# Patient Record
Sex: Male | Born: 1990 | Race: Black or African American | Hispanic: No | Marital: Single
Health system: Southern US, Community
[De-identification: ages and names within clinical notes are randomized; demographics above are authoritative.]

---
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
## 2020-02-10 ENCOUNTER — Ambulatory Visit: Payer: Self-pay | Attending: Internal Medicine

## 2020-02-10 DIAGNOSIS — Z23 Encounter for immunization: Secondary | ICD-10-CM

## 2020-02-10 NOTE — Progress Notes (Signed)
   [facility]  Name:  Joshua Campos    MRN: [id number] DOB: Aug 27, 1991  02/10/2020  Mr. [name] was observed post Covid-19 immunization for 15 minutes without incident. He was provided with Vaccine Information Sheet and instruction to access the V-Safe system.   Mr. [name] was instructed to call 911 with any severe reactions post vaccine: Marland Kitchen Difficulty breathing  . Swelling of face and throat  . A fast heartbeat  . A bad rash all over body  . Dizziness and weakness   Immunizations Administered    Name Date Dose VIS Date Route   Pfizer COVID-19 Vaccine 02/10/2020  9:28 AM 0.3 mL 10/08/2019 Intramuscular   Manufacturer: [employer], [employer]   Lot: [id number]   NDC: [address]-7

## 2020-02-18 ENCOUNTER — Other Ambulatory Visit: Payer: Self-pay

## 2020-02-18 ENCOUNTER — Emergency Department (HOSPITAL_COMMUNITY)
Admission: EM | Admit: 2020-02-18 | Discharge: 2020-02-18 | Disposition: A | Discharge status: Home / Self Care | Payer: Self-pay | Attending: Emergency Medicine | Admitting: Emergency Medicine

## 2020-02-18 DIAGNOSIS — Z5321 Procedure and treatment not carried out due to patient leaving prior to being seen by health care provider: Secondary | ICD-10-CM | POA: Insufficient documentation

## 2020-02-18 DIAGNOSIS — Z20822 Contact with and (suspected) exposure to covid-19: Secondary | ICD-10-CM | POA: Insufficient documentation

## 2020-02-18 DIAGNOSIS — J029 Acute pharyngitis, unspecified: Secondary | ICD-10-CM | POA: Insufficient documentation

## 2020-02-18 LAB — GROUP A STREP BY PCR: Group A Strep by PCR: NOT DETECTED

## 2020-02-18 LAB — POC SARS CORONAVIRUS 2 AG -  ED: SARS Coronavirus 2 Ag: NEGATIVE

## 2020-02-18 NOTE — ED Triage Notes (Signed)
Pt wants to be tested for Covid

## 2020-02-18 NOTE — ED Notes (Signed)
Pt stated they were leaving  

## 2020-03-06 ENCOUNTER — Ambulatory Visit: Payer: Self-pay | Attending: Internal Medicine

## 2020-03-06 DIAGNOSIS — Z23 Encounter for immunization: Secondary | ICD-10-CM

## 2020-03-06 NOTE — Progress Notes (Signed)
   Covid-19 Vaccination Clinic  Name:  Joshua Campos    MRN: [id number] DOB: [birth year]  03/06/2020  [name] was observed post Covid-19 immunization for 15 minutes without incident. He was provided with Vaccine Information Sheet and instruction to access the V-Safe system.   [name] was instructed to call 911 with any severe reactions post vaccine: Marland Kitchen Difficulty breathing  . Swelling of face and throat  . A fast heartbeat  . A bad rash all over body  . Dizziness and weakness   Immunizations Administered    Name Date Dose VIS Date Route   Pfizer COVID-19 Vaccine 03/06/2020  9:06 AM 0.3 mL 12/22/2018 Intramuscular   Manufacturer: [employer], [employer]   Lot: [id number]   NDC: [address]-9

## 2020-09-04 ENCOUNTER — Other Ambulatory Visit: Payer: Self-pay

## 2020-09-04 ENCOUNTER — Emergency Department (HOSPITAL_COMMUNITY)
Admission: EM | Admit: 2020-09-04 | Discharge: 2020-09-04 | Disposition: A | Discharge status: Home / Self Care | Payer: Self-pay | Attending: Emergency Medicine | Admitting: Emergency Medicine

## 2020-09-04 ENCOUNTER — Encounter (HOSPITAL_COMMUNITY): Payer: Self-pay | Admitting: Emergency Medicine

## 2020-09-04 DIAGNOSIS — W19XXXA Unspecified fall, initial encounter: Secondary | ICD-10-CM | POA: Insufficient documentation

## 2020-09-04 DIAGNOSIS — Y9289 Other specified places as the place of occurrence of the external cause: Secondary | ICD-10-CM | POA: Insufficient documentation

## 2020-09-04 DIAGNOSIS — M791 Myalgia, unspecified site: Secondary | ICD-10-CM | POA: Insufficient documentation

## 2020-09-04 NOTE — ED Notes (Signed)
Pt came out of  triage and said he will be leaving. Pt said he did not want to wait all night. Pt told risk of leaving.

## 2020-09-04 NOTE — ED Triage Notes (Signed)
Pt c/o left side of his body pain after falling today at work.
